# Patient Record
Sex: Male | Born: 1980 | Race: White | Hispanic: No | Marital: Married | Smoking: Never smoker
Health system: Southern US, Community
[De-identification: ages and names within clinical notes are randomized; demographics above are authoritative.]

---

## 2001-11-26 HISTORY — PX: MANDIBLE SURGERY: SHX707

## 2015-11-12 ENCOUNTER — Ambulatory Visit (HOSPITAL_COMMUNITY)
Admission: EM | Admit: 2015-11-12 | Discharge: 2015-11-12 | Disposition: A | Discharge status: Home / Self Care | Payer: Worker's Compensation | Source: Home / Self Care | Attending: Emergency Medicine | Admitting: Emergency Medicine

## 2015-11-12 ENCOUNTER — Encounter (HOSPITAL_COMMUNITY): Payer: Self-pay | Admitting: Emergency Medicine

## 2015-11-12 ENCOUNTER — Encounter (HOSPITAL_COMMUNITY)
Admission: EM | Disposition: A | Discharge status: Home / Self Care | Payer: Self-pay | Source: Home / Self Care | Attending: Emergency Medicine

## 2015-11-12 ENCOUNTER — Encounter (HOSPITAL_COMMUNITY): Payer: Self-pay | Admitting: *Deleted

## 2015-11-12 ENCOUNTER — Emergency Department (HOSPITAL_COMMUNITY): Payer: Worker's Compensation | Admitting: Anesthesiology

## 2015-11-12 ENCOUNTER — Emergency Department (HOSPITAL_COMMUNITY)
Admission: EM | Admit: 2015-11-12 | Discharge: 2015-11-12 | Disposition: A | Discharge status: Home / Self Care | Payer: Worker's Compensation | Attending: Emergency Medicine | Admitting: Emergency Medicine

## 2015-11-12 ENCOUNTER — Emergency Department (HOSPITAL_COMMUNITY): Payer: Worker's Compensation

## 2015-11-12 DIAGNOSIS — S68022A Partial traumatic metacarpophalangeal amputation of left thumb, initial encounter: Secondary | ICD-10-CM | POA: Diagnosis not present

## 2015-11-12 DIAGNOSIS — Z79899 Other long term (current) drug therapy: Secondary | ICD-10-CM | POA: Insufficient documentation

## 2015-11-12 DIAGNOSIS — S6992XA Unspecified injury of left wrist, hand and finger(s), initial encounter: Secondary | ICD-10-CM | POA: Diagnosis present

## 2015-11-12 DIAGNOSIS — Y92512 Supermarket, store or market as the place of occurrence of the external cause: Secondary | ICD-10-CM | POA: Insufficient documentation

## 2015-11-12 DIAGNOSIS — Y990 Civilian activity done for income or pay: Secondary | ICD-10-CM | POA: Insufficient documentation

## 2015-11-12 DIAGNOSIS — Z23 Encounter for immunization: Secondary | ICD-10-CM | POA: Insufficient documentation

## 2015-11-12 DIAGNOSIS — K219 Gastro-esophageal reflux disease without esophagitis: Secondary | ICD-10-CM | POA: Diagnosis not present

## 2015-11-12 DIAGNOSIS — W312XXA Contact with powered woodworking and forming machines, initial encounter: Secondary | ICD-10-CM | POA: Insufficient documentation

## 2015-11-12 DIAGNOSIS — S68012A Complete traumatic metacarpophalangeal amputation of left thumb, initial encounter: Secondary | ICD-10-CM

## 2015-11-12 HISTORY — PX: I & D EXTREMITY: SHX5045

## 2015-11-12 SURGERY — IRRIGATION AND DEBRIDEMENT EXTREMITY
Anesthesia: General | Laterality: Left

## 2015-11-12 MED ORDER — PROPOFOL 10 MG/ML IV BOLUS
INTRAVENOUS | Status: DC | PRN
  Administered 2015-11-12: 150 mg via INTRAVENOUS

## 2015-11-12 MED ORDER — ONDANSETRON HCL 4 MG/2ML IJ SOLN
INTRAMUSCULAR | Status: AC
  Filled 2015-11-12: qty 2

## 2015-11-12 MED ORDER — FENTANYL CITRATE (PF) 250 MCG/5ML IJ SOLN
INTRAMUSCULAR | Status: AC
  Filled 2015-11-12: qty 5

## 2015-11-12 MED ORDER — CEFAZOLIN SODIUM-DEXTROSE 2-3 GM-% IV SOLR
INTRAVENOUS | Status: AC
  Filled 2015-11-12: qty 50

## 2015-11-12 MED ORDER — PROPOFOL 10 MG/ML IV BOLUS
INTRAVENOUS | Status: AC
  Filled 2015-11-12: qty 40

## 2015-11-12 MED ORDER — PROMETHAZINE HCL 25 MG/ML IJ SOLN
6.2500 mg | Freq: Once | INTRAMUSCULAR | Status: AC
  Administered 2015-11-12: 6.25 mg via INTRAVENOUS

## 2015-11-12 MED ORDER — PROMETHAZINE HCL 25 MG/ML IJ SOLN
INTRAMUSCULAR | Status: AC
  Filled 2015-11-12: qty 1

## 2015-11-12 MED ORDER — ROCURONIUM BROMIDE 50 MG/5ML IV SOLN
INTRAVENOUS | Status: AC
  Filled 2015-11-12: qty 1

## 2015-11-12 MED ORDER — LACTATED RINGERS IV SOLN
INTRAVENOUS | Status: DC | PRN
  Administered 2015-11-12: 18:00:00 via INTRAVENOUS

## 2015-11-12 MED ORDER — DOCUSATE SODIUM 100 MG PO CAPS
100.0000 mg | ORAL_CAPSULE | Freq: Two times a day (BID) | ORAL | Status: AC
Start: 2015-11-12 — End: ?

## 2015-11-12 MED ORDER — CEFAZOLIN SODIUM 1-5 GM-% IV SOLN
1.0000 g | Freq: Once | INTRAVENOUS | Status: AC
  Administered 2015-11-12: 1 g via INTRAVENOUS
  Filled 2015-11-12: qty 50

## 2015-11-12 MED ORDER — DIPHENHYDRAMINE HCL 50 MG/ML IJ SOLN
INTRAMUSCULAR | Status: AC
  Filled 2015-11-12: qty 1

## 2015-11-12 MED ORDER — LIDOCAINE HCL (CARDIAC) 20 MG/ML IV SOLN
INTRAVENOUS | Status: AC
  Filled 2015-11-12: qty 10

## 2015-11-12 MED ORDER — BUPIVACAINE HCL (PF) 0.25 % IJ SOLN
INTRAMUSCULAR | Status: DC | PRN
  Administered 2015-11-12: 4 mL

## 2015-11-12 MED ORDER — ONDANSETRON HCL 4 MG/2ML IJ SOLN
INTRAMUSCULAR | Status: DC | PRN
  Administered 2015-11-12: 4 mg via INTRAVENOUS

## 2015-11-12 MED ORDER — OXYCODONE-ACETAMINOPHEN 5-325 MG PO TABS: 1.0000 | ORAL_TABLET | ORAL | Status: AC | PRN

## 2015-11-12 MED ORDER — BUPIVACAINE HCL (PF) 0.25 % IJ SOLN
INTRAMUSCULAR | Status: AC
  Filled 2015-11-12: qty 30

## 2015-11-12 MED ORDER — SODIUM CHLORIDE 0.9 % IR SOLN
Status: DC | PRN
  Administered 2015-11-12: 400 mL

## 2015-11-12 MED ORDER — STERILE WATER FOR INJECTION IJ SOLN
INTRAMUSCULAR | Status: AC
  Filled 2015-11-12: qty 10

## 2015-11-12 MED ORDER — ARTIFICIAL TEARS OP OINT
TOPICAL_OINTMENT | OPHTHALMIC | Status: AC
  Filled 2015-11-12: qty 3.5

## 2015-11-12 MED ORDER — MIDAZOLAM HCL 5 MG/5ML IJ SOLN
INTRAMUSCULAR | Status: DC | PRN
  Administered 2015-11-12: 2 mg via INTRAVENOUS

## 2015-11-12 MED ORDER — LIDOCAINE HCL 1 % IJ SOLN
INTRAMUSCULAR | Status: DC | PRN
  Administered 2015-11-12: 70 mg via INTRADERMAL

## 2015-11-12 MED ORDER — HYDROMORPHONE HCL 1 MG/ML IJ SOLN: 0.2500 mg | INTRAMUSCULAR | Status: DC | PRN

## 2015-11-12 MED ORDER — HYDROMORPHONE HCL 1 MG/ML IJ SOLN
1.0000 mg | Freq: Once | INTRAMUSCULAR | Status: AC
  Administered 2015-11-12: 1 mg via INTRAVENOUS
  Filled 2015-11-12: qty 1

## 2015-11-12 MED ORDER — PHENYLEPHRINE 40 MCG/ML (10ML) SYRINGE FOR IV PUSH (FOR BLOOD PRESSURE SUPPORT)
PREFILLED_SYRINGE | INTRAVENOUS | Status: AC
  Filled 2015-11-12: qty 10

## 2015-11-12 MED ORDER — CEFAZOLIN SODIUM-DEXTROSE 2-3 GM-% IV SOLR
INTRAVENOUS | Status: DC | PRN
  Administered 2015-11-12: 2 g via INTRAVENOUS

## 2015-11-12 MED ORDER — FENTANYL CITRATE (PF) 250 MCG/5ML IJ SOLN
INTRAMUSCULAR | Status: DC | PRN
  Administered 2015-11-12: 50 ug via INTRAVENOUS
  Administered 2015-11-12: 100 ug via INTRAVENOUS

## 2015-11-12 MED ORDER — CEPHALEXIN 500 MG PO CAPS: 500.0000 mg | ORAL_CAPSULE | Freq: Four times a day (QID) | ORAL | Status: AC

## 2015-11-12 MED ORDER — PHENYLEPHRINE HCL 10 MG/ML IJ SOLN
INTRAMUSCULAR | Status: AC
  Filled 2015-11-12: qty 1

## 2015-11-12 MED ORDER — MIDAZOLAM HCL 2 MG/2ML IJ SOLN
INTRAMUSCULAR | Status: AC
  Filled 2015-11-12: qty 2

## 2015-11-12 MED ORDER — TETANUS-DIPHTH-ACELL PERTUSSIS 5-2.5-18.5 LF-MCG/0.5 IM SUSP
0.5000 mL | Freq: Once | INTRAMUSCULAR | Status: AC
  Administered 2015-11-12: 0.5 mL via INTRAMUSCULAR
  Filled 2015-11-12: qty 0.5

## 2015-11-12 MED ORDER — DIPHENHYDRAMINE HCL 50 MG/ML IJ SOLN
INTRAMUSCULAR | Status: DC | PRN
  Administered 2015-11-12: 12.5 mg via INTRAVENOUS

## 2015-11-12 MED ORDER — EPHEDRINE SULFATE 50 MG/ML IJ SOLN
INTRAMUSCULAR | Status: AC
  Filled 2015-11-12: qty 1

## 2015-11-12 SURGICAL SUPPLY — 55 items
BANDAGE ELASTIC 3 VELCRO ST LF (GAUZE/BANDAGES/DRESSINGS) ×3 IMPLANT
BANDAGE ELASTIC 4 VELCRO ST LF (GAUZE/BANDAGES/DRESSINGS) ×3 IMPLANT
BANDAGE GAUZE 4  KLING STR (GAUZE/BANDAGES/DRESSINGS) ×3 IMPLANT
BNDG COHESIVE 1X5 TAN STRL LF (GAUZE/BANDAGES/DRESSINGS) IMPLANT
BNDG CONFORM 2 STRL LF (GAUZE/BANDAGES/DRESSINGS) IMPLANT
BNDG ESMARK 4X9 LF (GAUZE/BANDAGES/DRESSINGS) ×3 IMPLANT
BNDG GAUZE ELAST 4 BULKY (GAUZE/BANDAGES/DRESSINGS) ×3 IMPLANT
CORDS BIPOLAR (ELECTRODE) ×3 IMPLANT
COVER SURGICAL LIGHT HANDLE (MISCELLANEOUS) ×3 IMPLANT
CUFF TOURNIQUET SINGLE 18IN (TOURNIQUET CUFF) ×3 IMPLANT
CUFF TOURNIQUET SINGLE 24IN (TOURNIQUET CUFF) IMPLANT
DRAIN PENROSE 1/4X12 LTX STRL (WOUND CARE) IMPLANT
DRAPE SURG 17X23 STRL (DRAPES) IMPLANT
DRSG ADAPTIC 3X8 NADH LF (GAUZE/BANDAGES/DRESSINGS) ×3 IMPLANT
DRSG EMULSION OIL 3X3 NADH (GAUZE/BANDAGES/DRESSINGS) ×3 IMPLANT
ELECT REM PT RETURN 9FT ADLT (ELECTROSURGICAL)
ELECTRODE REM PT RTRN 9FT ADLT (ELECTROSURGICAL) IMPLANT
GAUZE SPONGE 4X4 12PLY STRL (GAUZE/BANDAGES/DRESSINGS) ×3 IMPLANT
GAUZE XEROFORM 1X8 LF (GAUZE/BANDAGES/DRESSINGS) ×3 IMPLANT
GAUZE XEROFORM 5X9 LF (GAUZE/BANDAGES/DRESSINGS) IMPLANT
GLOVE BIOGEL PI IND STRL 8.5 (GLOVE) ×1 IMPLANT
GLOVE BIOGEL PI INDICATOR 8.5 (GLOVE) ×2
GLOVE SURG ORTHO 8.0 STRL STRW (GLOVE) ×3 IMPLANT
GOWN STRL REUS W/ TWL LRG LVL3 (GOWN DISPOSABLE) ×3 IMPLANT
GOWN STRL REUS W/ TWL XL LVL3 (GOWN DISPOSABLE) ×1 IMPLANT
GOWN STRL REUS W/TWL LRG LVL3 (GOWN DISPOSABLE) ×6
GOWN STRL REUS W/TWL XL LVL3 (GOWN DISPOSABLE) ×2
HANDPIECE INTERPULSE COAX TIP (DISPOSABLE)
KIT BASIN OR (CUSTOM PROCEDURE TRAY) ×3 IMPLANT
KIT ROOM TURNOVER OR (KITS) ×3 IMPLANT
MANIFOLD NEPTUNE II (INSTRUMENTS) ×3 IMPLANT
NEEDLE HYPO 25GX1X1/2 BEV (NEEDLE) IMPLANT
NS IRRIG 1000ML POUR BTL (IV SOLUTION) ×3 IMPLANT
PACK ORTHO EXTREMITY (CUSTOM PROCEDURE TRAY) ×3 IMPLANT
PAD ARMBOARD 7.5X6 YLW CONV (MISCELLANEOUS) ×6 IMPLANT
PAD CAST 4YDX4 CTTN HI CHSV (CAST SUPPLIES) ×1 IMPLANT
PADDING CAST COTTON 4X4 STRL (CAST SUPPLIES) ×2
SET HNDPC FAN SPRY TIP SCT (DISPOSABLE) IMPLANT
SOAP 2 % CHG 4 OZ (WOUND CARE) ×3 IMPLANT
SPONGE GAUZE 4X4 12PLY STER LF (GAUZE/BANDAGES/DRESSINGS) ×3 IMPLANT
SPONGE LAP 18X18 X RAY DECT (DISPOSABLE) ×3 IMPLANT
SPONGE LAP 4X18 X RAY DECT (DISPOSABLE) ×3 IMPLANT
SUCTION FRAZIER TIP 10 FR DISP (SUCTIONS) ×3 IMPLANT
SUT ETHILON 4 0 PS 2 18 (SUTURE) IMPLANT
SUT ETHILON 5 0 P 3 18 (SUTURE)
SUT NYLON ETHILON 5-0 P-3 1X18 (SUTURE) IMPLANT
SYR CONTROL 10ML LL (SYRINGE) IMPLANT
TOWEL OR 17X24 6PK STRL BLUE (TOWEL DISPOSABLE) ×3 IMPLANT
TOWEL OR 17X26 10 PK STRL BLUE (TOWEL DISPOSABLE) ×3 IMPLANT
TUBE ANAEROBIC SPECIMEN COL (MISCELLANEOUS) IMPLANT
TUBE CONNECTING 12'X1/4 (SUCTIONS) ×1
TUBE CONNECTING 12X1/4 (SUCTIONS) ×2 IMPLANT
UNDERPAD 30X30 INCONTINENT (UNDERPADS AND DIAPERS) ×3 IMPLANT
WATER STERILE IRR 1000ML POUR (IV SOLUTION) ×3 IMPLANT
YANKAUER SUCT BULB TIP NO VENT (SUCTIONS) ×3 IMPLANT

## 2015-11-12 NOTE — Progress Notes (Signed)
D; c/o unable to urination, very dribbles      Bladder scan done, 33440ml,>332ml noted. Pt stated would try BR , encuraged to drink, Assisted to BR.

## 2015-11-12 NOTE — Op Note (Signed)
NAMEWILIAM, CAUTHORN NO.:  [id number]  MEDICAL RECORD NO.:  [id number]  LOCATION:  [facility]                         FACILITY:  [facility]  PHYSICIAN:  Sharma Covert IV, M.D.DATE OF BIRTH:  10/22/1981  DATE OF PROCEDURE:  11/12/2015 DATE OF DISCHARGE:                              OPERATIVE REPORT   PREOPERATIVE DIAGNOSIS:  Left thumb distal tip amputation.  POSTOPERATIVE DIAGNOSIS:  Left thumb distal tip amputation.  ANESTHESIA:  General via LMA.  PROCEDURE: 1. Debridement of skin and subcutaneous tissue and bone associated     with open fracture, left thumb. 2. Open treatment of left thumb distal phalanx fracture without     internal fixation. 3. Left thumb volar advancement flap, Moberg advancement flap closure. 4. Excision nail plate and nail bed for traumatic amputation.  SURGICAL INDICATIONS:  Mr. Fetters is a right-hand dominant gentleman, who sustained an open distal tip amputation while at work.  The patient had the avulsion of the nail plate in the oblique laceration through the nail bed and distal phalanx.  The patient was seen and evaluated and recommended to undergo the above procedure.  Risks, benefits, and alternatives were discussed in detail with the patient and signed informed consent was obtained.  Risks include, but not limited to bleeding; infection; damage to nearby nerves, arteries, or tendons; loss of motion of wrist and digits; incomplete relief of symptoms; need for further surgical intervention.  DESCRIPTION OF PROCEDURE:  The patient was properly identified in the preop holding area, marked with a permanent marker made on the left thumb to indicate correct operative site.  The patient was brought back to the operating room, placed supine on the anesthesia room table. General anesthesia was administered.  The patient tolerated this well. A well-padded tourniquet placed on left brachium, sealed with 1000 drape.  Left upper extremity was  then prepped and draped in normal sterile fashion.  Time-out was called.  Correct site was identified, and procedure then begun.  Attention then turned left thumb.  Tourniquet had been insufflated.  Preoperative antibiotics were given.  The excisional debridement of the skin, subcutaneous tissue, and bone was then carried out of the open fracture site as done with sharp knife and scissors. Removal of the nail plate was then completed with the nail bed and the nail plate what little remained was excised.  Maintaining the length of the bone, the comminuted fracture fragments were then taken out.  Open treatment of distal phalanx fracture was done without internal fixation with excision of the bone fragments.  The wound was irrigated.  The bone was not shortened.  It was gently smoothed off with a rongeur.  After thorough wound irrigation and removal of the nail bed, there was a defect dorsally.  Following this, Moberg advancement flap was then carried out.  The skin incision made along the mid axial line of the thumb taken down radially and ulnarly protecting the neurovascular bundle with the flexor sheath.  Once this was carried out, this was able to be mobilized distally.  The skin was mobilized dorsally and was able to close this very nicely without significant tension.  Following this, the skin was  then closed with 4-0 and 5-0 Prolene sutures.  Adaptic dressing, sterile compressive bandage was applied.  The tourniquet had been deflated with good perfusion to  the thumb tip.  Sterile compressive bandage was applied.  The patient tolerated the procedure well and returned to recovery room in good condition.  POSTPROCEDURAL PLAN:  The patient discharged home, seen back in the office in approximately 6 days for tip protector splint and begin a postoperative therapy regimen, getting back his mobility use as the wound heals.  Radiographs at the first visit.     Madelynn DoneFred W. Giliana Vantil IV,  M.D.     FWO/MEDQ  D:  11/12/2015  T:  11/12/2015  Job:  [id number]

## 2015-11-12 NOTE — Anesthesia Procedure Notes (Signed)
Procedure Name: Intubation Date/Time: 11/12/2015 5:58 PM Performed by: Brien MatesMAHONY, Naziah Weckerly D Pre-anesthesia Checklist: Patient identified, Emergency Drugs available, Suction available, Patient being monitored and Timeout performed Patient Re-evaluated:Patient Re-evaluated prior to inductionOxygen Delivery Method: Circle system utilized Preoxygenation: Pre-oxygenation with 100% oxygen Ventilation: Mask ventilation without difficulty LMA: LMA inserted LMA Size: 4.0 Number of attempts: 1 Placement Confirmation: positive ETCO2 and breath sounds checked- equal and bilateral Tube secured with: Tape Dental Injury: Teeth and Oropharynx as per pre-operative assessment

## 2015-11-12 NOTE — ED Notes (Signed)
[facility] amputation of L thumb (amputated portion in bag at bedside).  Given 10 mg morphine, 4 mg zofran en-route.  Bleeding initially controlled, but now bleeding through bandage.

## 2015-11-12 NOTE — H&P (Signed)
Jesse Gutierrez is an 34 y.o. male.   Chief Complaint: Left thumb injury HPI: Patient presents with complaint of distal left thumb amputation occurring just prior to arrival. Patient was cutting meat at a grocery store with a band saw cut his finger. Bleeding was controlled with pressure. EMS gave patient 10 mg of morphine, 4 milligrams of Zofran. Tetanus is not up-to-date. No other treatments prior to arrival. No other injury. Patient is right-handed. Course is constant. Nothing makes symptoms better or worse.  History reviewed. No pertinent past medical history.  Past Surgical History  Procedure Laterality Date  . Mandible surgery  2003    History reviewed. No pertinent family history. Social History:  reports that he has never smoked. He has never used smokeless tobacco. He reports that he does not drink alcohol or use illicit drugs.  Allergies:  Allergies  Allergen Reactions  . Theophyllines Nausea And Vomiting    SLO-Bid    Medications Prior to Admission  Medication Sig Dispense Refill  . albuterol (PROVENTIL HFA;VENTOLIN HFA) 108 (90 BASE) MCG/ACT inhaler Inhale 1 puff into the lungs every 6 (six) hours as needed for wheezing or shortness of breath.    . cetirizine (ZYRTEC) 10 MG tablet Take 10 mg by mouth daily.    Marland Kitchen oxymetazoline (AFRIN) 0.05 % nasal spray Place 1 spray into both nostrils 2 (two) times daily as needed for congestion.    . ranitidine (ZANTAC) 150 MG tablet Take 150 mg by mouth daily.      No results found for this or any previous visit (from the past 48 hour(s)). Dg Finger Thumb Left  11/12/2015  CLINICAL DATA:  34 year old male with left thumb saw injury today. Initial encounter. EXAM: LEFT THUMB 2+V COMPARISON:  None. FINDINGS: Amputation of the thumb from the mid aspect of the distal phalanx noted. A small fracture fragment remains within the soft tissue. There is no evidence of subluxation or dislocation. IMPRESSION: Injury/ amputation of the distal left  thumb. Electronically Signed   By: Harmon Pier M.D.   On: 11/12/2015 15:02    ROS NO RECENT ILLNESSES OR HOSPITALIZATIONS  Blood pressure 128/84, pulse 65, temperature 98.2 F (36.8 C), temperature source Oral, resp. rate 22, height 6' (1.829 m), weight 74.844 kg (165 lb), SpO2 97 %. Physical Exam  General Appearance:  Alert, cooperative, no distress, appears stated age  Head:  Normocephalic, without obvious abnormality, atraumatic  Eyes:  Pupils equal, conjunctiva/corneas clear,         Throat: Lips, mucosa, and tongue normal; teeth and gums normal  Neck: No visible masses     Lungs:   respirations unlabored  Chest Wall:  No tenderness or deformity  Heart:  Regular rate and rhythm,  Abdomen:   Soft, non-tender,         Extremities: LEFT THUMB: DISTAL TIP AMPUTATION WITH AVULSION OF NAIL PLATE ABLE TO FLEX THUMB IP JOINT FINGERS WARM WELL PERFUSED GOOD DIGITAL MOTION  Pulses: 2+ and symmetric  Skin: Skin color, texture, turgor normal, no rashes or lesions     Neurologic: Normal    Assessment/Plan LEFT THUMB OPEN DISTAL PHALANX FRACTURE/AMPUTATION  LEFT THUMB OPEN DEBRIDEMENT AND REVISION AMPUTATION, ADVANCEMENT FLAP CLOSURE  R/B/A DISCUSSED WITH PT IN OFFICE.  PT VOICED UNDERSTANDING OF PLAN CONSENT SIGNED DAY OF SURGERY PT SEEN AND EXAMINED PRIOR TO OPERATIVE PROCEDURE/DAY OF SURGERY SITE MARKED. QUESTIONS ANSWERED WILL GO HOME FOLLOWING SURGERY  WE ARE PLANNING SURGERY FOR YOUR UPPER EXTREMITY. THE RISKS AND BENEFITS OF  SURGERY INCLUDE BUT NOT LIMITED TO BLEEDING INFECTION, DAMAGE TO NEARBY NERVES ARTERIES TENDONS, FAILURE OF SURGERY TO ACCOMPLISH ITS INTENDED GOALS, PERSISTENT SYMPTOMS AND NEED FOR FURTHER SURGICAL INTERVENTION. WITH THIS IN MIND WE WILL PROCEED. I HAVE DISCUSSED WITH THE PATIENT THE PRE AND POSTOPERATIVE REGIMEN AND THE DOS AND DON'TS. PT VOICED UNDERSTANDING AND INFORMED CONSENT SIGNED.  Sharma CovertORTMANN,Bethenny Losee W 11/12/2015, 5:41 PM

## 2015-11-12 NOTE — ED Notes (Signed)
Pt off unit with xray 

## 2015-11-12 NOTE — ED Provider Notes (Signed)
CSN: [id number]     Arrival date & time 11/12/15  1414 History   First MD Initiated Contact with Patient 11/12/15 1416     Chief Complaint  Patient presents with  . Finger Injury     (Consider location/radiation/quality/duration/timing/severity/associated sxs/prior Treatment) HPI Comments: Patient presents with complaint of distal left thumb amputation occurring just prior to arrival. Patient was cutting meat at a grocery store with a band saw cut his finger. Bleeding was controlled with pressure. EMS gave patient 10 mg of morphine, 4 milligrams of Zofran. Tetanus is not up-to-date. No other treatments prior to arrival. No other injury. Patient is right-handed. Course is constant. Nothing makes symptoms better or worse.  The history is provided by the patient.    History reviewed. No pertinent past medical history. History reviewed. No pertinent past surgical history. No family history on file. Social History  Substance Use Topics  . Smoking status: None  . Smokeless tobacco: None  . Alcohol Use: None    Review of Systems  Constitutional: Negative for activity change.  Musculoskeletal: Positive for arthralgias. Negative for neck pain.  Skin: Negative for wound.  Neurological: Negative for weakness and numbness.      Allergies  Review of patient's allergies indicates not on file.  Home Medications   Prior to Admission medications   Not on File   BP 137/81 mmHg  Pulse 85  Temp(Src) 98.2 F (36.8 C) (Oral)  Ht 6' (1.829 m)  Wt 74.844 kg  BMI 22.37 kg/m2  SpO2 100%   Physical Exam  Constitutional: He appears well-developed and well-nourished.  HENT:  Head: Normocephalic and atraumatic.  Eyes: Conjunctivae are normal. Right eye exhibits no discharge. Left eye exhibits no discharge.  Neck: Normal range of motion. Neck supple.  Cardiovascular: Normal rate, regular rhythm, normal heart sounds and normal pulses.   Pulmonary/Chest: Effort normal and breath sounds  normal.  Abdominal: Soft. There is no tenderness.  Musculoskeletal: He exhibits tenderness. He exhibits no edema.       Left hand: He exhibits tenderness, deformity and laceration.       Hands: Neurological: He is alert. No sensory deficit.  Skin: Skin is warm and dry.  Psychiatric: He has a normal mood and affect.  Nursing note and vitals reviewed.        ED Course  Procedures (including critical care time) Labs Review Labs Reviewed  I-STAT CHEM 8, ED    Imaging Review Dg Finger Thumb Left  11/12/2015  CLINICAL DATA:  34 year old male with left thumb saw injury today. Initial encounter. EXAM: LEFT THUMB 2+V COMPARISON:  None. FINDINGS: Amputation of the thumb from the mid aspect of the distal phalanx noted. A small fracture fragment remains within the soft tissue. There is no evidence of subluxation or dislocation. IMPRESSION: Injury/ amputation of the distal left thumb. Electronically Signed   By: Harmon Pier M.D.   On: 11/12/2015 15:02   I have personally reviewed and evaluated these images and lab results as part of my medical decision-making.   EKG Interpretation None       Patient seen and examined. Antibiotics and tetanus ordered. Wound re-bandaged with non-adherant dressing.    Vital signs reviewed and are as follows: BP 137/81 mmHg  Pulse 85  Temp(Src) 98.2 F (36.8 C) (Oral)  Ht 6' (1.829 m)  Wt 74.844 kg  BMI 22.37 kg/m2  SpO2 100%  Spoke with Dr. Melvyn Novas who will evaluate in the ED.   MDM   Final diagnoses:  Amputation of thumb, left, initial encounter   Patient with amputation of tip of left thumb. Will need revision by orthopedic hand surgery.   Renne CriglerJoshua Naythen Heikkila, PA-C 11/12/15 1539  Azalia BilisKevin Campos, MD 11/12/15 1556

## 2015-11-12 NOTE — ED Notes (Signed)
Pt returned to ER to get tetanus shot that he did not receive this morning.

## 2015-11-12 NOTE — Transfer of Care (Signed)
Immediate Anesthesia Transfer of Care Note  Patient: Jesse Gutierrez  Procedure(s) Performed: Procedure(s): IRRIGATION AND DEBRIDEMENT AND REVISION AND AMPUTATION OF THUMB (Left)  Patient Location: PACU  Anesthesia Type:General  Level of Consciousness: awake  Airway & Oxygen Therapy: Patient Spontanous Breathing  Post-op Assessment: Report given to RN and Post -op Vital signs reviewed and stable  Post vital signs: Reviewed and stable  Last Vitals:  Filed Vitals:   11/12/15 1530 11/12/15 1600  BP: 127/75 128/84  Pulse: 72 65  Temp:    Resp: 13 22    Complications: No apparent anesthesia complications

## 2015-11-12 NOTE — Anesthesia Postprocedure Evaluation (Signed)
Anesthesia Post Note  Patient: Jesse Gutierrez  Procedure(s) Performed: Procedure(s) (LRB): IRRIGATION AND DEBRIDEMENT AND REVISION AND AMPUTATION OF THUMB (Left)  Patient location during evaluation: PACU Anesthesia Type: General Level of consciousness: awake and alert Pain management: pain level controlled Vital Signs Assessment: post-procedure vital signs reviewed and stable Respiratory status: spontaneous breathing, nonlabored ventilation and respiratory function stable Cardiovascular status: blood pressure returned to baseline and stable Postop Assessment: no signs of nausea or vomiting Anesthetic complications: no    Last Vitals:  Filed Vitals:   11/12/15 1915 11/12/15 1922  BP: 142/86 149/87  Pulse: 87 84  Temp: 36.6 C   Resp: 14 11    Last Pain:  Filed Vitals:   11/12/15 1937  PainSc: 0-No pain                 Arianis Bowditch,W. EDMOND

## 2015-11-12 NOTE — ED Provider Notes (Signed)
Patient is here for a tetanus shot that was ordered earlier today and did not receive it.  Patient is alert and oriented 3.  He is in no distress.  Charlestine NightChristopher Raistlin Gum, PA-C 11/12/15 2207  Gerhard Munchobert Lockwood, MD 11/12/15 2249

## 2015-11-12 NOTE — Brief Op Note (Signed)
11/12/2015  5:43 PM  PATIENT:  Jesse Gutierrez  34 y.o. male  PRE-OPERATIVE DIAGNOSIS:  left thumb revision  POST-OPERATIVE DIAGNOSIS:  * No post-op diagnosis entered *  PROCEDURE:  Procedure(s): IRRIGATION AND DEBRIDEMENT AND REVISION AND AMPUTATION OF THUMB (Left)  SURGEON:  Surgeon(s) and Role:    * Bradly BienenstockFred Zacary Bauer, MD - Primary  PHYSICIAN ASSISTANT:   ASSISTANTS: none   ANESTHESIA:   general  EBL:     BLOOD ADMINISTERED:none  DRAINS: none   LOCAL MEDICATIONS USED:  MARCAINE     SPECIMEN:  No Specimen  DISPOSITION OF SPECIMEN:  N/A  COUNTS:  YES  TOURNIQUET:  * Missing tourniquet times found for documented tourniquets in log:  [id number] *  DICTATION: .Other Dictation: Dictation Number [id number]  PLAN OF CARE: Discharge to home after PACU  PATIENT DISPOSITION:  PACU - hemodynamically stable.   Delay start of Pharmacological VTE agent (>24hrs) due to surgical blood loss or risk of bleeding: not applicable

## 2015-11-12 NOTE — Anesthesia Preprocedure Evaluation (Addendum)
Anesthesia Evaluation  Patient identified by MRN, date of birth, ID band Patient awake    Reviewed: Allergy & Precautions, H&P , NPO status , Patient's Chart, lab work & pertinent test results  Airway Mallampati: II  TM Distance: >3 FB Neck ROM: Full    Dental no notable dental hx. (+) Teeth Intact, Dental Advisory Given   Pulmonary neg pulmonary ROS,    Pulmonary exam normal breath sounds clear to auscultation       Cardiovascular negative cardio ROS   Rhythm:Regular Rate:Normal     Neuro/Psych negative neurological ROS  negative psych ROS   GI/Hepatic Neg liver ROS, GERD  Medicated and Controlled,  Endo/Other  negative endocrine ROS  Renal/GU negative Renal ROS  negative genitourinary   Musculoskeletal   Abdominal   Peds  Hematology negative hematology ROS (+)   Anesthesia Other Findings   Reproductive/Obstetrics negative OB ROS                             Anesthesia Physical Anesthesia Plan  ASA: II and emergent  Anesthesia Plan: General   Post-op Pain Management:    Induction: Intravenous  Airway Management Planned: LMA  Additional Equipment:   Intra-op Plan:   Post-operative Plan: Extubation in OR  Informed Consent: I have reviewed the patients History and Physical, chart, labs and discussed the procedure including the risks, benefits and alternatives for the proposed anesthesia with the patient or authorized representative who has indicated his/her understanding and acceptance.   Dental advisory given  Plan Discussed with: CRNA, Anesthesiologist and Surgeon  Anesthesia Plan Comments:        Anesthesia Quick Evaluation

## 2015-11-12 NOTE — Discharge Instructions (Signed)
KEEP BANDAGE CLEAN AND DRY CALL OFFICE FOR F/U APPT [phone] IN SIX DAYS DR Melvyn NovasTMANN CELL [phone] KEEP HAND ELEVATED ABOVE HEART OK TO APPLY ICE TO OPERATIVE AREA CONTACT OFFICE IF ANY WORSENING PAIN OR CONCERNS.

## 2015-11-14 ENCOUNTER — Encounter (HOSPITAL_COMMUNITY): Payer: Self-pay | Admitting: Orthopedic Surgery

## 2016-11-16 IMAGING — DX DG FINGER THUMB 2+V*L*
3 series · 3 of 3 positions shown · non-contrast
Comparison: None.

CLINICAL DATA: 34-year-old male with left thumb saw injury today.
Initial encounter.

EXAM:
LEFT THUMB 2+V

[finger ap]
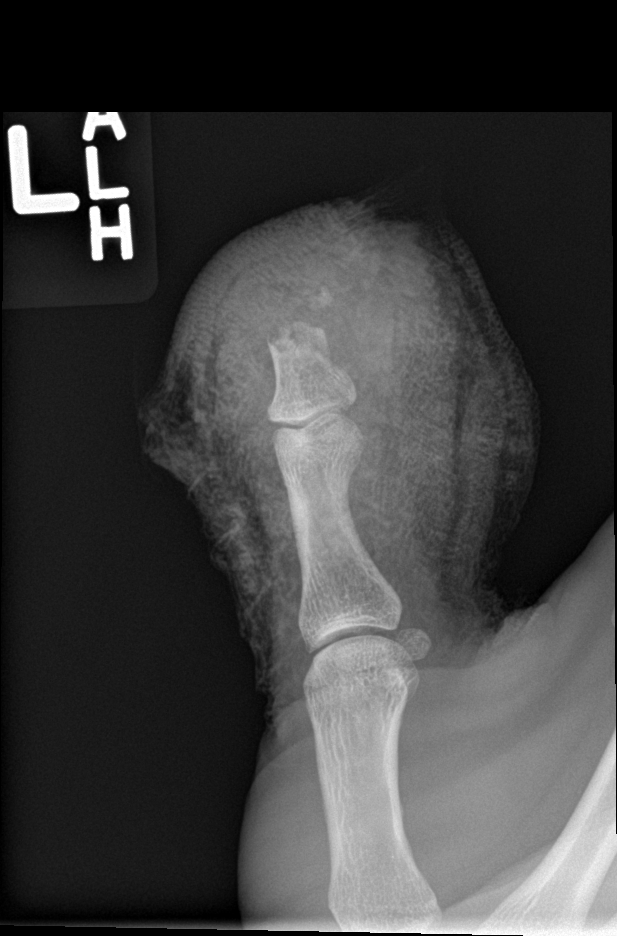

[finger obl]
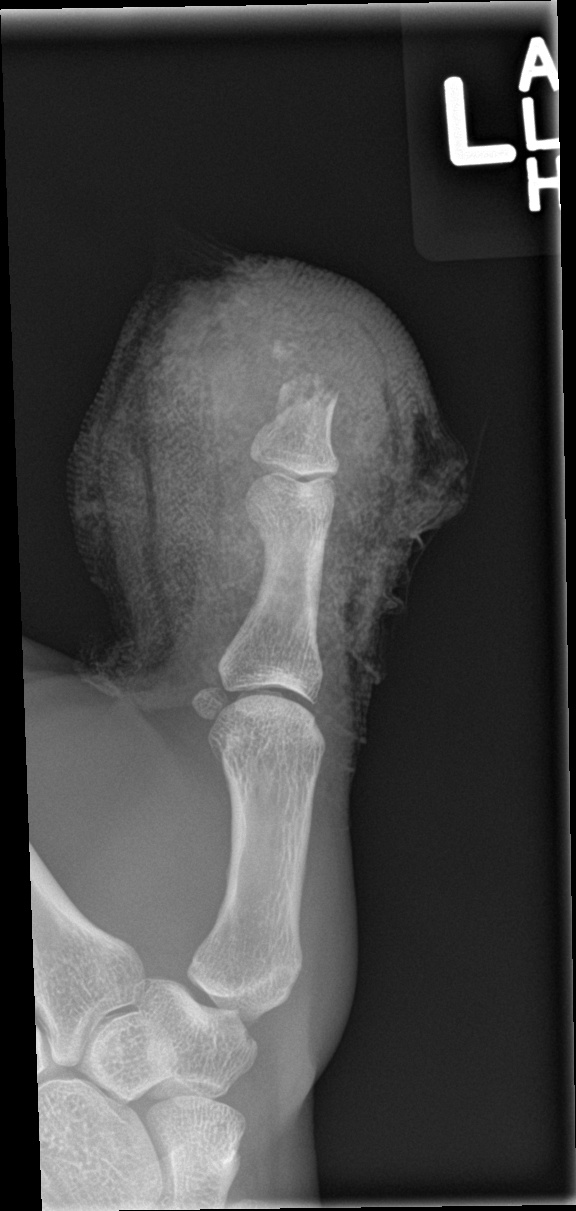

[finger lat]
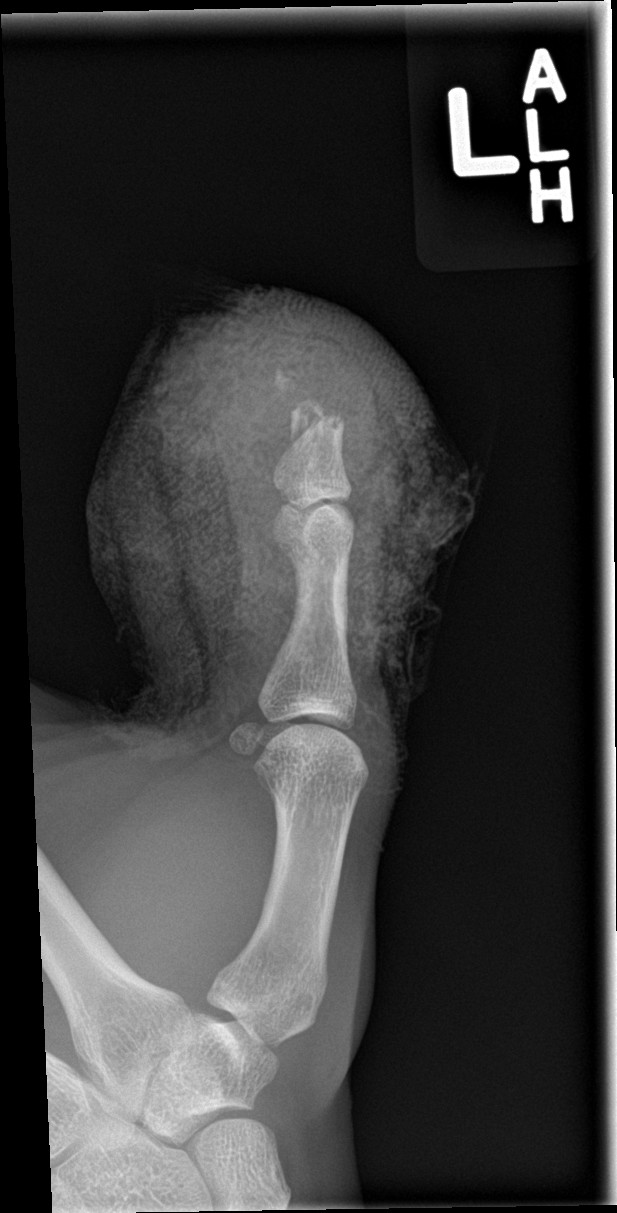

[3 of 3 positions shown; findings below may reference images not displayed]

FINDINGS: Amputation of the thumb from the mid aspect of the distal phalanx
noted. A small fracture fragment remains within the soft tissue.

There is no evidence of subluxation or dislocation.
IMPRESSION: Injury/ amputation of the distal left thumb.
